# Patient Record
Sex: Male | Born: 1985 | Race: White | Hispanic: No
Health system: Southern US, Community
[De-identification: ages and names within clinical notes are randomized; demographics above are authoritative.]

---

## 2019-08-16 ENCOUNTER — Emergency Department (HOSPITAL_COMMUNITY): Payer: No Typology Code available for payment source

## 2019-08-16 ENCOUNTER — Other Ambulatory Visit: Payer: Self-pay

## 2019-08-16 ENCOUNTER — Emergency Department (HOSPITAL_COMMUNITY)
Admission: EM | Admit: 2019-08-16 | Discharge: 2019-08-16 | Disposition: A | Discharge status: Home / Self Care | Payer: No Typology Code available for payment source | Attending: Emergency Medicine | Admitting: Emergency Medicine

## 2019-08-16 DIAGNOSIS — S51812A Laceration without foreign body of left forearm, initial encounter: Secondary | ICD-10-CM | POA: Diagnosis not present

## 2019-08-16 DIAGNOSIS — F909 Attention-deficit hyperactivity disorder, unspecified type: Secondary | ICD-10-CM | POA: Diagnosis not present

## 2019-08-16 DIAGNOSIS — S41112A Laceration without foreign body of left upper arm, initial encounter: Secondary | ICD-10-CM

## 2019-08-16 DIAGNOSIS — Y990 Civilian activity done for income or pay: Secondary | ICD-10-CM | POA: Insufficient documentation

## 2019-08-16 DIAGNOSIS — Z23 Encounter for immunization: Secondary | ICD-10-CM | POA: Insufficient documentation

## 2019-08-16 DIAGNOSIS — Y9259 Other trade areas as the place of occurrence of the external cause: Secondary | ICD-10-CM | POA: Diagnosis not present

## 2019-08-16 DIAGNOSIS — Y9389 Activity, other specified: Secondary | ICD-10-CM | POA: Diagnosis not present

## 2019-08-16 DIAGNOSIS — W260XXA Contact with knife, initial encounter: Secondary | ICD-10-CM | POA: Diagnosis not present

## 2019-08-16 LAB — BASIC METABOLIC PANEL
Anion gap: 6 (ref 5–15)
BUN: 21 mg/dL — ABNORMAL HIGH (ref 6–20)
CO2: 25 mmol/L (ref 22–32)
Calcium: 8.5 mg/dL — ABNORMAL LOW (ref 8.9–10.3)
Chloride: 105 mmol/L (ref 98–111)
Creatinine, Ser: 0.86 mg/dL (ref 0.61–1.24)
GFR calc Af Amer: >60 mL/min (ref >60)
GFR calc non Af Amer: >60 mL/min (ref >60)
Glucose, Bld: 113 mg/dL — ABNORMAL HIGH (ref 70–99)
Potassium: 4 mmol/L (ref 3.5–5.1)
Sodium: 136 mmol/L (ref 135–145)

## 2019-08-16 LAB — CBC
HCT: 48 % (ref 39.0–52.0)
Hemoglobin: 15.6 g/dL (ref 13.0–17.0)
MCH: 31.3 pg (ref 26.0–34.0)
MCHC: 32.5 g/dL (ref 30.0–36.0)
MCV: 96.2 fL (ref 80.0–100.0)
Platelets: 308 10*3/uL (ref 150–400)
RBC: 4.99 MIL/uL (ref 4.22–5.81)
RDW: 12.8 % (ref 11.5–15.5)
WBC: 14.9 10*3/uL — ABNORMAL HIGH (ref 4.0–10.5)
nRBC: 0 % (ref 0.0–0.2)

## 2019-08-16 MED ORDER — HYDROCODONE-ACETAMINOPHEN 5-325 MG PO TABS
2.0000 | ORAL_TABLET | Freq: Once | ORAL | Status: AC
  Administered 2019-08-16: 2 via ORAL
  Filled 2019-08-16: qty 2

## 2019-08-16 MED ORDER — HYDROCODONE-ACETAMINOPHEN 5-325 MG PO TABS: 2.0000 | ORAL_TABLET | Freq: Once | ORAL | Status: DC

## 2019-08-16 MED ORDER — LIDOCAINE HCL (PF) 1 % IJ SOLN
30.0000 mL | Freq: Once | INTRAMUSCULAR | Status: AC
  Administered 2019-08-16: 30 mL via INTRADERMAL
  Filled 2019-08-16: qty 30

## 2019-08-16 MED ORDER — HYDROCODONE-ACETAMINOPHEN 5-325 MG PO TABS: 1.0000 | ORAL_TABLET | Freq: Four times a day (QID) | ORAL | 0 refills | Status: AC | PRN

## 2019-08-16 MED ORDER — TETANUS-DIPHTH-ACELL PERTUSSIS 5-2.5-18.5 LF-MCG/0.5 IM SUSP
0.5000 mL | Freq: Once | INTRAMUSCULAR | Status: AC
  Administered 2019-08-16: 0.5 mL via INTRAMUSCULAR
  Filled 2019-08-16: qty 0.5

## 2019-08-16 MED ORDER — IOHEXOL 350 MG/ML SOLN
100.0000 mL | Freq: Once | INTRAVENOUS | Status: AC | PRN
  Administered 2019-08-16: 100 mL via INTRAVENOUS

## 2019-08-16 NOTE — Discharge Instructions (Signed)
Apply bacitracin and change dressing once or twice daily.  Hydrocodone as prescribed as needed for pain.  Sutures are to be removed in the next 10 to 14 days.  Follow-up with your primary doctor for this.  Return to the emergency department sooner if you develop redness, pus draining from the wound, fever, red streaks up or down the arm, or other new and concerning symptoms.

## 2019-08-16 NOTE — ED Provider Notes (Signed)
[facility] EMERGENCY DEPARTMENT Provider Note   CSN: [id number] Arrival date & time: 08/16/19  0206     History No chief complaint on file.   Victor Booth is a 34 y.o. male.  Patient is a 34 year old male with history of ADHD.  He presents today for a left arm laceration.  Patient was at work at [employer].  He was using a knife to open a strap on a crate when the knife slipped and he stabbed himself in the left arm.  He had significant bleeding which was eventually controlled with direct pressure.  Patient's last tetanus shot 15 years ago.  The history is provided by the patient.       No past medical history on file.  There are no problems to display for this patient.        No family history on file.  Social History   Tobacco Use  . Smoking status: Not on file  Substance Use Topics  . Alcohol use: Not on file  . Drug use: Not on file    Home Medications Prior to Admission medications   Not on File    Allergies    Patient has no allergy information on record.  Review of Systems   Review of Systems  All other systems reviewed and are negative.   Physical Exam Updated Vital Signs BP (!) 146/72 (BP Location: Right Arm)   Pulse 87   Temp 97.6 F (36.4 C) (Oral)   Resp 20   SpO2 100%   Physical Exam Vitals and nursing note reviewed.  Constitutional:      General: He is not in acute distress.    Appearance: Normal appearance. He is not ill-appearing.  HENT:     Head: Normocephalic and atraumatic.  Cardiovascular:     Rate and Rhythm: Normal rate.  Pulmonary:     Effort: Pulmonary effort is normal.  Musculoskeletal:     Comments: The left arm has a 4 cm laceration to the antecubital region.  Bleeding is controlled.  There is some fatty tissue exposed, however no obvious tendon injury.  Ulnar and radial pulses are easily palpable and motor and sensation are intact throughout the entire hand.  He is able to flex and extend all  fingers, flex the wrist, and flex the elbow.  Skin:    General: Skin is warm and dry.  Neurological:     Mental Status: He is alert.     ED Results / Procedures / Treatments   Labs (all labs ordered are listed, but only abnormal results are displayed) Labs Reviewed  CBC - Abnormal; Notable for the following components:      Result Value   WBC 14.9 (*)    All other components within normal limits  BASIC METABOLIC PANEL    EKG None  Radiology No results found.  Procedures Procedures (including critical care time)  Medications Ordered in ED Medications  lidocaine (PF) (XYLOCAINE) 1 % injection 30 mL (30 mLs Intradermal Given 08/16/19 0222)  Tdap (BOOSTRIX) injection 0.5 mL (0.5 mLs Intramuscular Given 08/16/19 0221)    ED Course  I have reviewed the triage vital signs and the nursing notes.  Pertinent labs & imaging results that were available during my care of the patient were reviewed by me and considered in my medical decision making (see chart for details).  Patient presents here with a laceration to the left antecubital fossa that he sustained at work.  He was using a knife  to cut his strap off of a crate when it slipped and cut himself.  Bleeding controlled upon arrival to the ER.  Patient has a 4 cm laceration to the antecubital fossa which was repaired as below.  Patient's tetanus was updated.  Ulnar and radial pulses were easily palpable and there is no evidence for tendon injury with the flexors of the fingers, wrist, or elbow.  Motor and sensation are intact to all fingers.  Patient did undergo CT angiogram of the left arm to rule out vascular injury.  This did not show any arterial injury, but did show a laceration and intramuscular hemorrhage to the brachialis.  Patient will be placed in a sling and discharged to home.  He will be advised to do no heavy lifting and to follow-up as needed for any problems.  Sutures are to be removed in 10 to 14 days.  MDM  Rules/Calculators/A&P   LACERATION REPAIR Performed by: Veryl Speak Authorized by: Veryl Speak Consent: Verbal consent obtained. Risks and benefits: risks, benefits and alternatives were discussed Consent given by: patient Patient identity confirmed: provided demographic data Prepped and Draped in normal sterile fashion Wound explored  Laceration Location: left arm (antecubital region)  Laceration Length: 4 cm  No Foreign Bodies seen or palpated  Anesthesia: local infiltration  Local anesthetic: lidocaine 1% without epinephrine  Anesthetic total: 5 ml  Irrigation method: syringe Amount of cleaning: standard  Skin closure: 3-0 ethilon  Number of sutures: 7  Technique: simple interrupted  Patient tolerance: Patient tolerated the procedure well with no immediate complications.   Final Clinical Impression(s) / ED Diagnoses Final diagnoses:  None    Rx / DC Orders ED Discharge Orders    None       Veryl Speak, MD 08/16/19 [phone]

## 2019-08-16 NOTE — ED Triage Notes (Signed)
Pt. BIB [facility] from work ([employer]) c/o laceration 1 1/2 inches to left inner elbow with pocket knief. Bleeding controlled with pressure dressing on scene. Last tetanus shot unknown.   100 mcg fent given by EMS

## 2019-08-16 NOTE — ED Notes (Signed)
Discharge instructions discussed with pt. Pt verbalized understanding with no questions at this time. Pt to go home with co-worker

## 2020-06-06 ENCOUNTER — Other Ambulatory Visit: Payer: Self-pay

## 2021-03-03 IMAGING — CT CT ANGIO EXTREM UP*L*
2 of 10 series · 10 of 33 positions shown · IV contrast (Omni 300)
Comparison: Radiograph 08/16/2019

CLINICAL DATA: Laceration inner elbow with pocket knife,
neurovascular, ligamentous or tendinous injury suspected

EXAM:
CT OF THE UPPER LEFT EXTREMITY WITHOUT CONTRAST
TECHNIQUE: Multidetector CT imaging of the upper left extremity was performed
according to the standard protocol.

[Series 6: 3.0 · axial · 0.59mm/px · z∈[-620,-302]mm · 2 of 320 slices shown]
[im 107/320  soft-tissue]
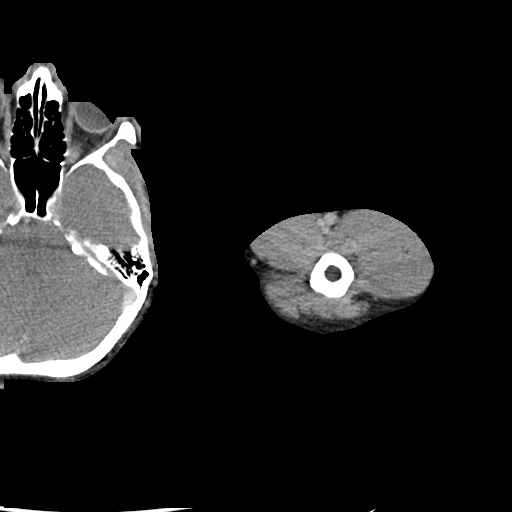
[im 213/320  soft-tissue]
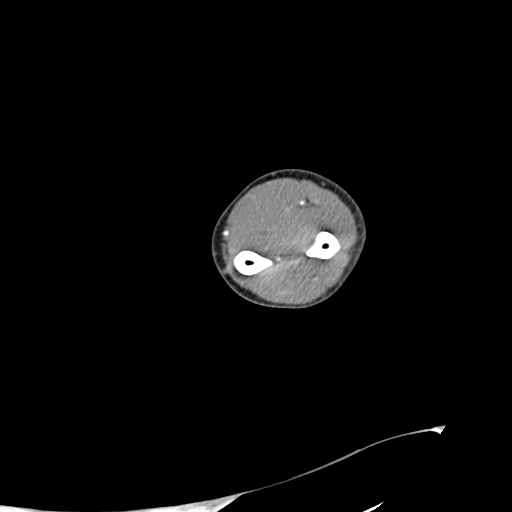

[Series 7: 1.0 ax · axial · 0.59mm/px · z∈[-834,-88]mm · 8 of 959 slices shown]
[im 107/959  soft-tissue]
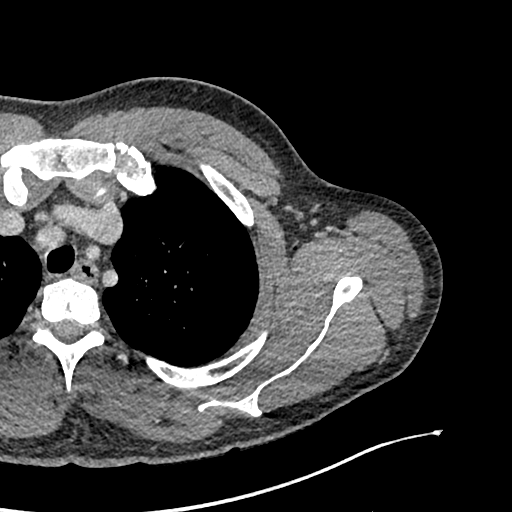
[im 213/959  bone]
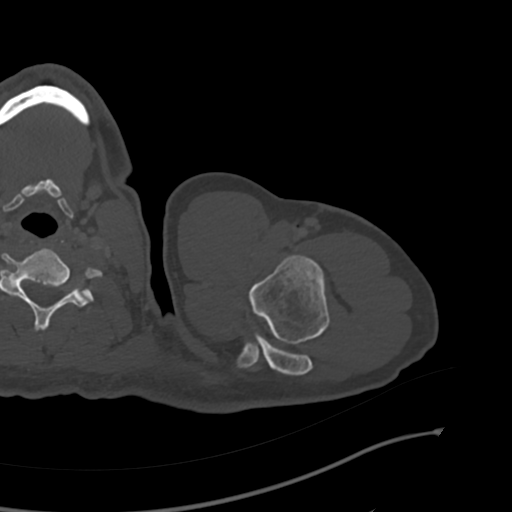
[im 320/959  soft-tissue]
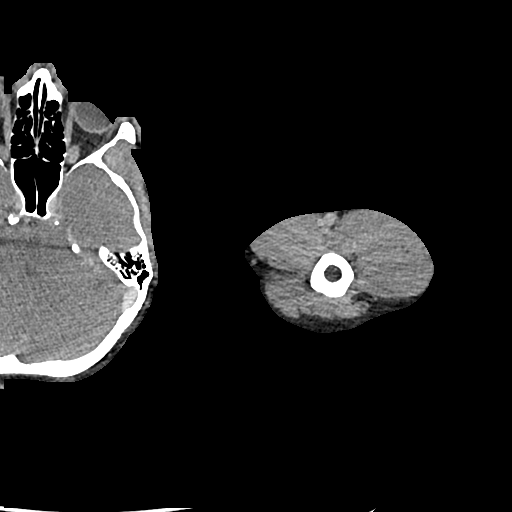
[im 426/959  bone]
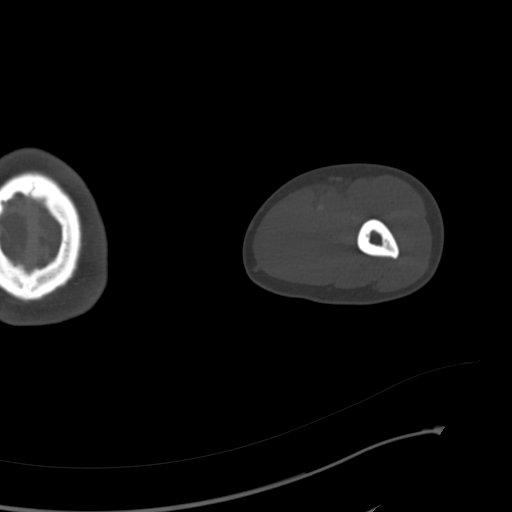
[im 533/959  soft-tissue]
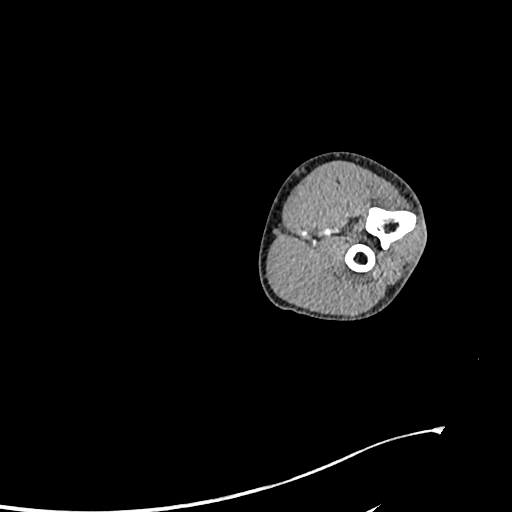
[im 639/959  bone]
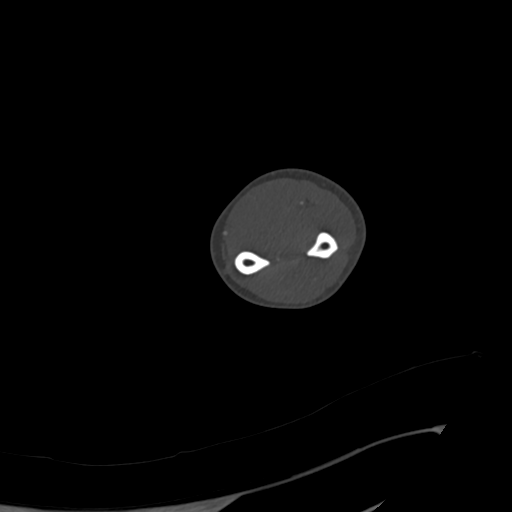
[im 746/959  soft-tissue]
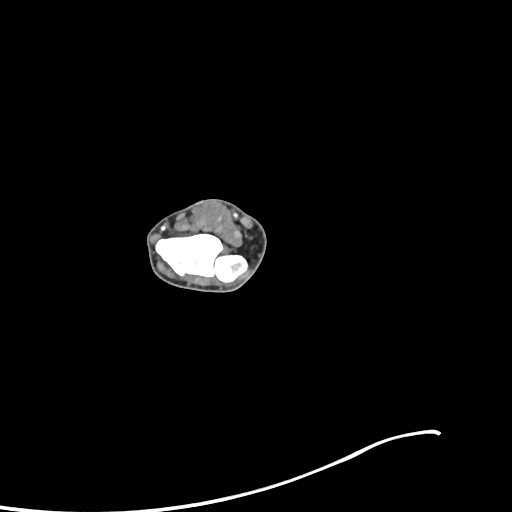
[im 852/959  bone]
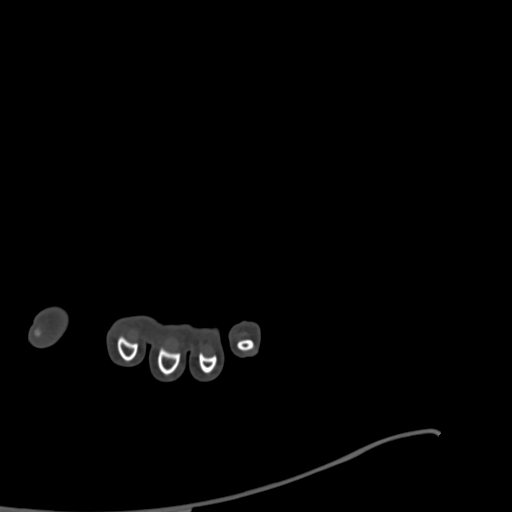

[10 of 33 positions shown; findings below may reference images not displayed]

FINDINGS: Angiography:

Satisfactory arterial opacification. Normal 3 vessel branching of
the aortic arch. Subclavian artery arises normally with normal
opacification. Normal branching of the left vertebral artery. Normal
branching of the thoracic and scapular arteries. Axillary arterial
segment is unremarkable. Normal appearance of the axillary artery to
the level of the bifurcation with the deep brachial artery. The
brachial artery proper normally opacifies without evidence of direct
vascular injury in the region of laceration in the antecubital
fossa. Normal opacification of the bifurcation of the radial and
ulnar arteries. The interosseous arteries normally opacified. Normal
opacification of the radial and ulnar arteries to the level of the
wrist with satisfactory opacification of the palmar arches.

Limited evaluation of the venous system reveal some foci of gas at
the level of the brachial vein in the upper arm and axillary vein
more proximally, may be appears to track along the surface of the
vessels possibly reflecting gas within the fascial planes.

Penetrating injury:

There is soft tissue defect at the level of the medial antecubital
fossa with subcutaneous fat stranding and few foci of gas within the
brachialis. Suspect a small amount of intra muscular hemorrhage and
laceration as well. No associated osseous injury.

Bones/Joint/Cartilage

No acute fracture or traumatic malalignment. Osseous structures are
unremarkable. Alignment at the elbow and shoulder are unremarkable.
No associated elbow joint effusion. No intra-articular gas.

Ligaments

Suboptimally assessed by CT.

Muscles and Tendons

Small amount of gas and likely trace intramuscular hemorrhage within
the brachialis musculature. Small amount of soft tissue stranding
and gas with air tracking along the fascial planes, as detailed
above.

Soft tissues

Soft tissue stranding and gas with soft tissue defect along the
medial antecubital fossa, as detailed above. Remaining soft tissues
are unremarkable.
IMPRESSION: 1. Penetrating injury at the level of the medial antecubital fossa
with small amount of intra muscular hemorrhage and laceration of the
brachialis. No direct arterial vascular injury or associated osseous
injury is seen. No active contrast extravasation at the site of
penetrating injury in the medial antecubital fossa.
2. Few foci of gas in the mid upper arm and axillary region appear
to track along the surface of the brachial and axillary veins. No
definitive intravenous gas is seen.

## 2021-03-03 IMAGING — DX DG ELBOW COMPLETE 3+V*L*
4 series · 4 of 4 positions shown · non-contrast
Comparison: None.

CLINICAL DATA: Medial left elbow laceration

EXAM:
LEFT ELBOW - COMPLETE 3+ VIEW

[elbow ap]
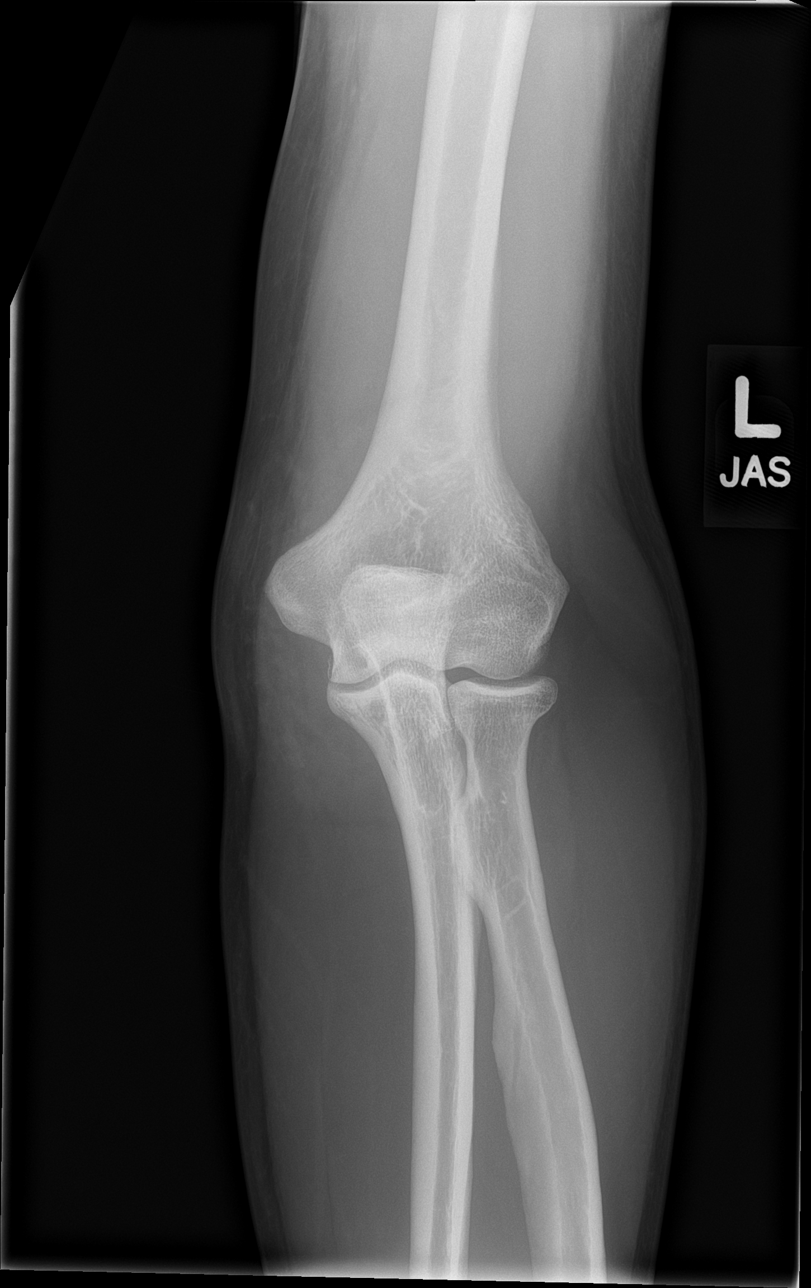

[elbow obl (1 of 2)]
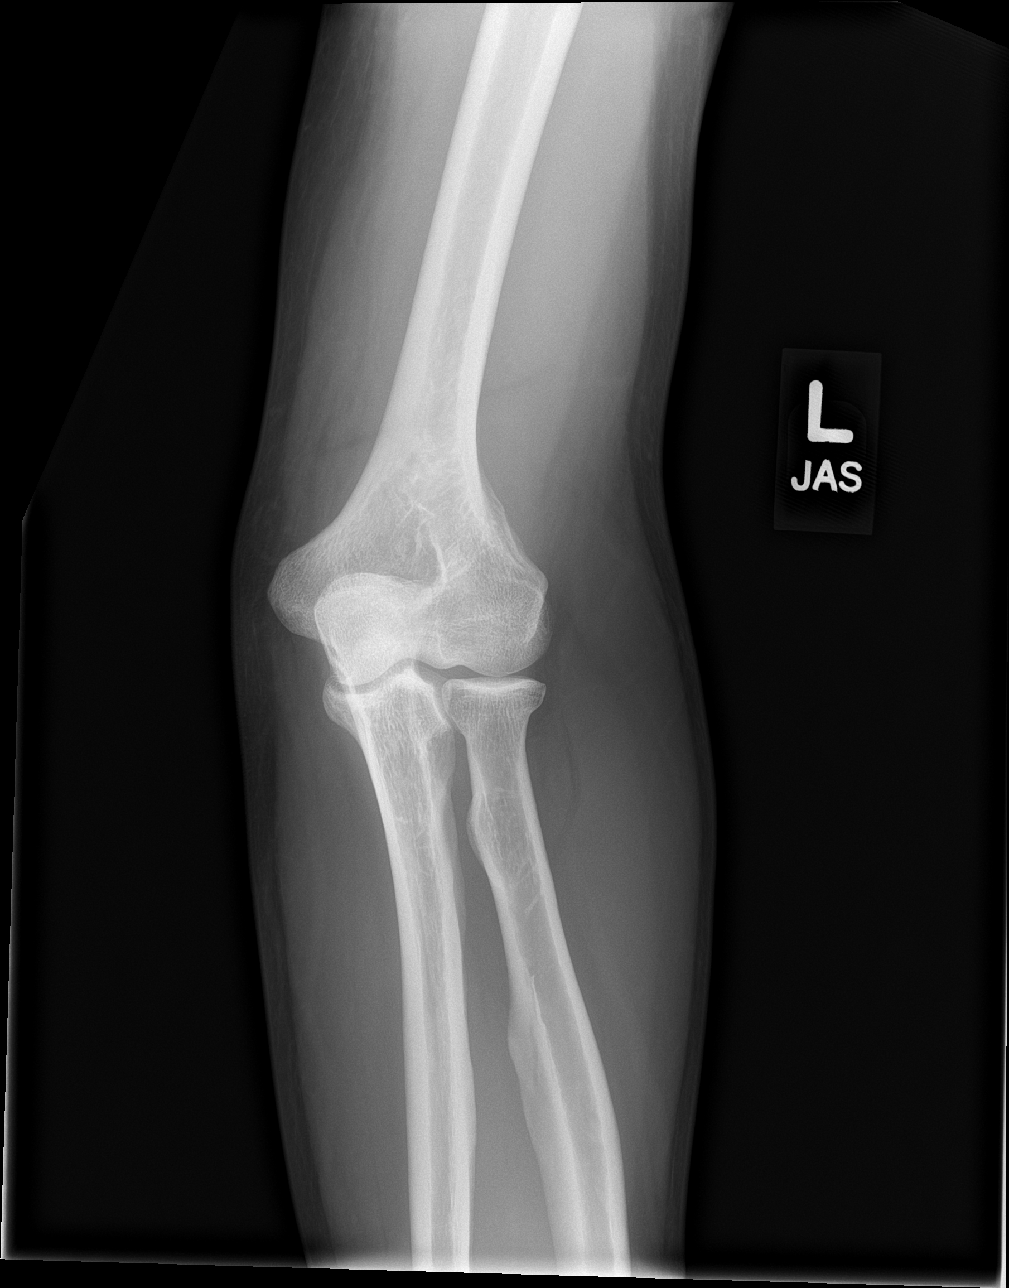

[elbow obl (2 of 2)]
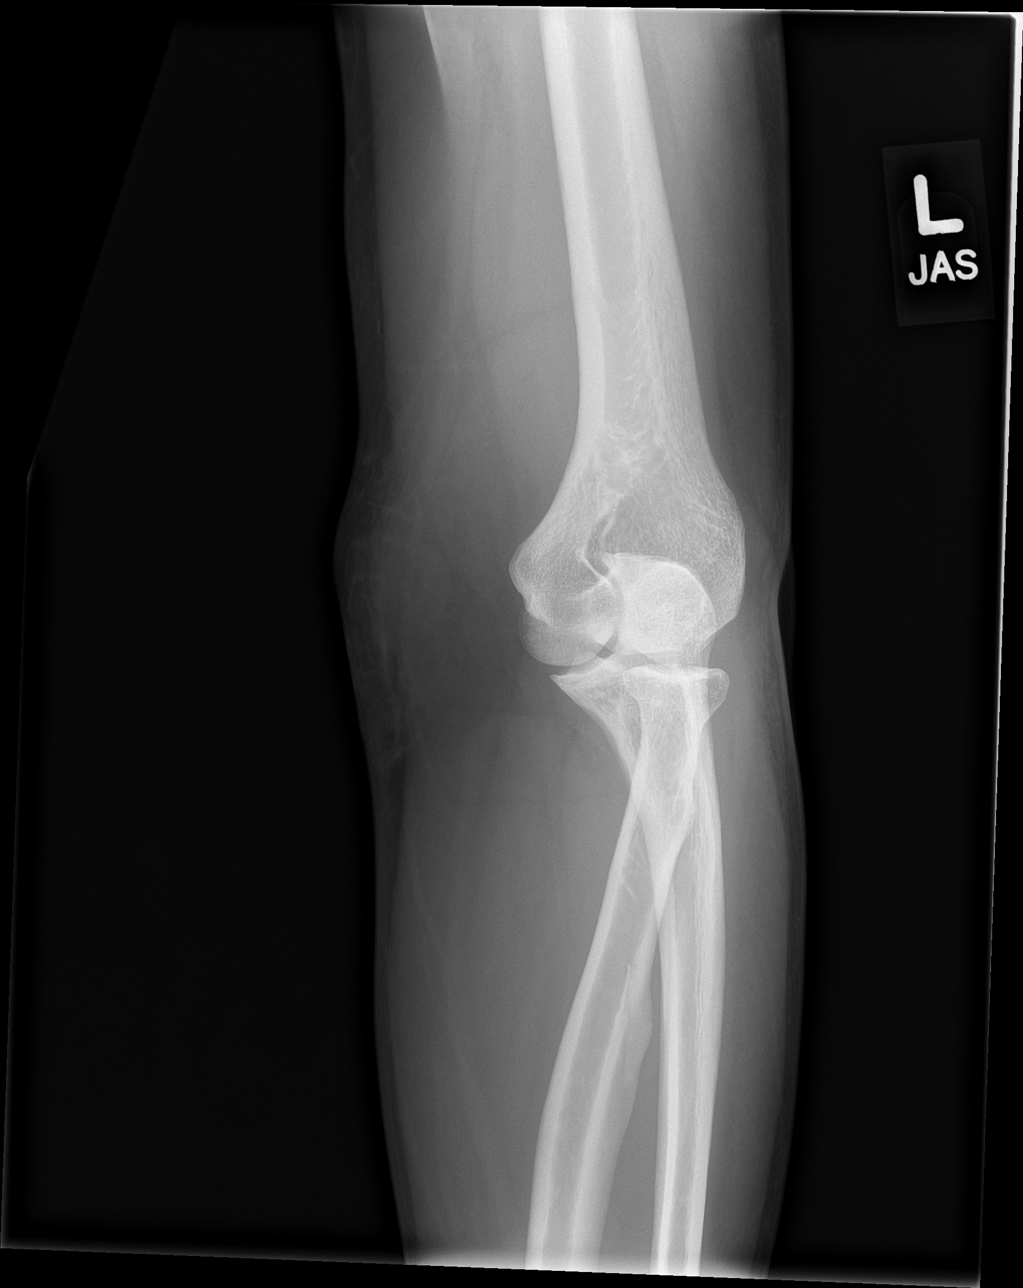

[elbow lat]
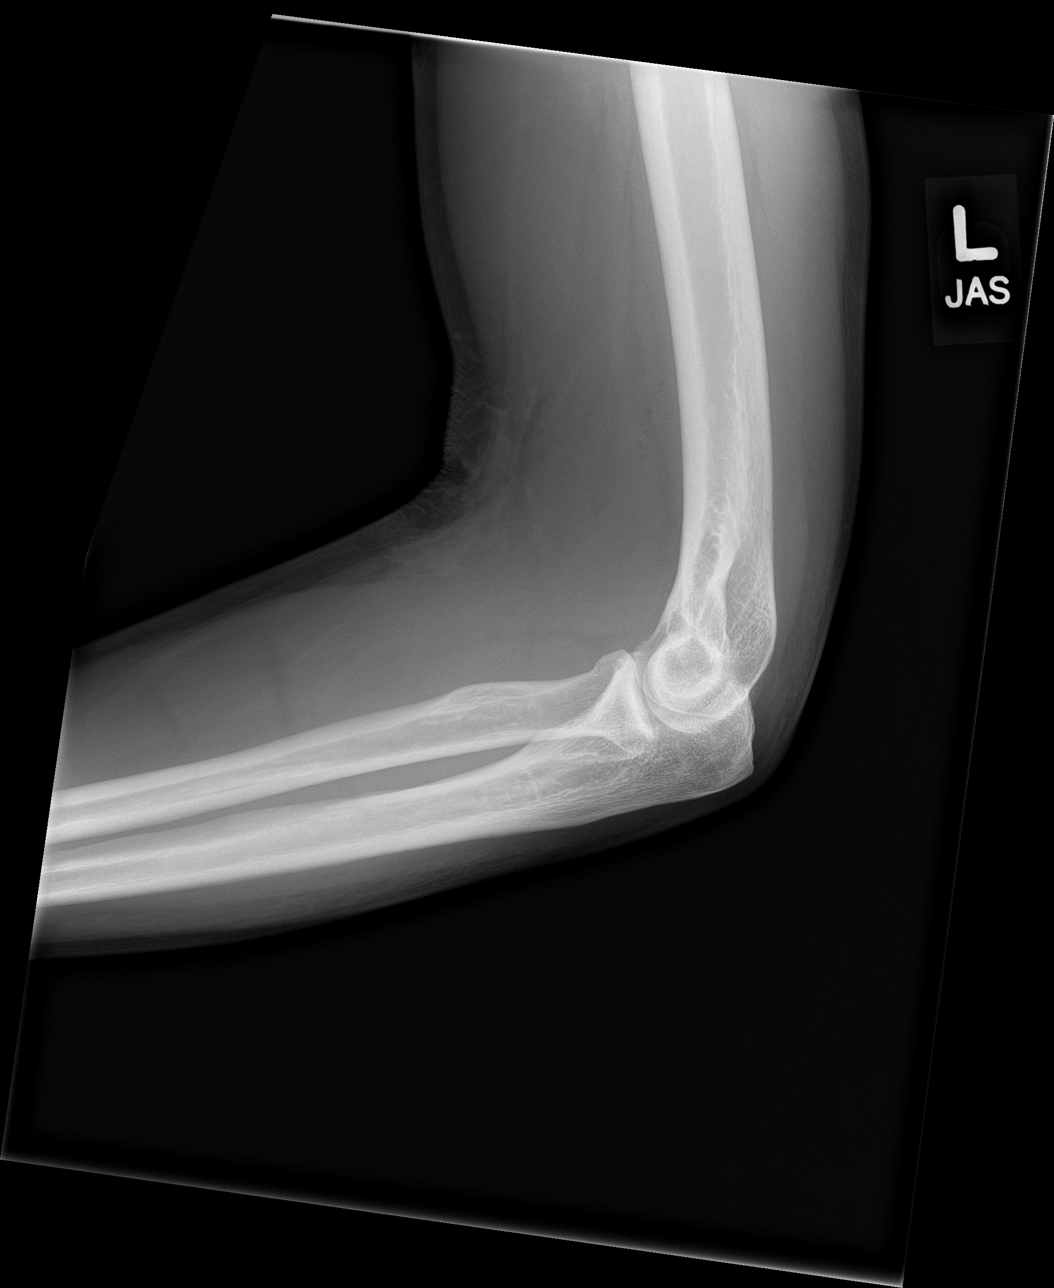

[4 of 4 positions shown; findings below may reference images not displayed]

FINDINGS: Frontal, bilateral oblique, and lateral views of the left elbow are
obtained. No fracture or radiopaque foreign body. Alignment is
anatomic. No joint effusion.
IMPRESSION: 1. No fracture or radiopaque foreign body.
